# Patient Record
Sex: Male | Born: 1960 | Race: Asian | Hispanic: No | Marital: Married | Smoking: Former smoker
Health system: Southern US, Community
[De-identification: ages and names within clinical notes are randomized; demographics above are authoritative.]

---

## 2017-10-16 ENCOUNTER — Emergency Department
Admission: EM | Admit: 2017-10-16 | Discharge: 2017-10-16 | Disposition: A | Discharge status: Home / Self Care | Payer: Managed Care, Other (non HMO) | Attending: Emergency Medicine | Admitting: Emergency Medicine

## 2017-10-16 ENCOUNTER — Encounter: Payer: Self-pay | Admitting: Emergency Medicine

## 2017-10-16 ENCOUNTER — Emergency Department: Payer: Managed Care, Other (non HMO)

## 2017-10-16 DIAGNOSIS — W182XXA Fall in (into) shower or empty bathtub, initial encounter: Secondary | ICD-10-CM | POA: Diagnosis not present

## 2017-10-16 DIAGNOSIS — Y999 Unspecified external cause status: Secondary | ICD-10-CM | POA: Diagnosis not present

## 2017-10-16 DIAGNOSIS — S2232XA Fracture of one rib, left side, initial encounter for closed fracture: Secondary | ICD-10-CM | POA: Diagnosis not present

## 2017-10-16 DIAGNOSIS — Y939 Activity, unspecified: Secondary | ICD-10-CM | POA: Diagnosis not present

## 2017-10-16 DIAGNOSIS — Z87891 Personal history of nicotine dependence: Secondary | ICD-10-CM | POA: Insufficient documentation

## 2017-10-16 DIAGNOSIS — Y929 Unspecified place or not applicable: Secondary | ICD-10-CM | POA: Diagnosis not present

## 2017-10-16 DIAGNOSIS — S299XXA Unspecified injury of thorax, initial encounter: Secondary | ICD-10-CM | POA: Diagnosis present

## 2017-10-16 MED ORDER — TRAMADOL HCL 50 MG PO TABS: 50.0000 mg | ORAL_TABLET | Freq: Four times a day (QID) | ORAL | 0 refills | Status: AC | PRN

## 2017-10-16 NOTE — ED Triage Notes (Signed)
Patient presents to the ED with rib pain on the right side after falling getting out of the bathtub.  Patient denies any shortness of breath.  Reports some discomfort with deep breathing.  Patient states if he braces his ribs, breathing feels better.

## 2017-10-16 NOTE — ED Notes (Signed)
See triage note  States he fell hitting left rib area on bath tub  Ambulates well   Denies any SOB but does have pain on inspiration

## 2017-10-16 NOTE — Discharge Instructions (Addendum)
Follow discharge care instruction.  Advised repeat chest x-ray in 3 weeks.  Take medication as needed for pain.

## 2017-10-16 NOTE — ED Provider Notes (Signed)
[facility] Emergency Department Provider Note   ____________________________________________   First MD Initiated Contact with Patient 10/16/17 1024     (approximate)  I have reviewed the triage vital signs and the nursing notes.   HISTORY  Chief Complaint Fall    HPI Benjamin Fuentes is a 57 y.o. male complain of left rib pain secondary to slipping in bathtub.  States pain increases with deep inspirations.  Pain decreases with splinting of the left upper ribs.  Patient denies dyspnea.  Patient states the pain is a constant 4/10 and increases to a 6/10 with deep inspirations.  Patient described the pain is "achy".  No palliative measures for complaint.  History reviewed. No pertinent past medical history.  There are no active problems to display for this patient.   History reviewed. No pertinent surgical history.  Prior to Admission medications   Medication Sig Start Date End Date Taking? Authorizing Provider  traMADol (ULTRAM) 50 MG tablet Take 1 tablet (50 mg total) by mouth every 6 (six) hours as needed. 10/16/17 10/16/18  Joni Reining, PA-C    Allergies Patient has no known allergies.  No family history on file.  Social History Social History   Tobacco Use  . Smoking status: Former Games developer  . Smokeless tobacco: Former Neurosurgeon    Quit date: 10/16/1997  Substance Use Topics  . Alcohol use: Not on file  . Drug use: Not on file    Review of Systems Constitutional: No fever/chills Eyes: No visual changes. ENT: No sore throat. Cardiovascular: Denies chest pain. Respiratory: Denies shortness of breath. Gastrointestinal: No abdominal pain.  No nausea, no vomiting.  No diarrhea.  No constipation. Genitourinary: Negative for dysuria. Musculoskeletal: Left lateral rib pain. Skin: Negative for rash. Neurological: Negative for headaches, focal weakness or numbness.   ____________________________________________   PHYSICAL EXAM:  VITAL  SIGNS: ED Triage Vitals  Enc Vitals Group     BP 10/16/17 1007 120/82     Pulse Rate 10/16/17 1007 66     Resp --      Temp 10/16/17 1007 98.2 F (36.8 C)     Temp Source 10/16/17 1007 Oral     SpO2 10/16/17 1007 99 %     Weight 10/16/17 1007 147 lb (66.7 kg)     Height 10/16/17 1007 5\' 8"  (1.727 m)     Head Circumference --      Peak Flow --      Pain Score 10/16/17 1021 4     Pain Loc --      Pain Edu? --      Excl. in GC? --    Constitutional: Alert and oriented. Well appearing and in no acute distress. Cardiovascular: Normal rate, regular rhythm. Grossly normal heart sounds.  Good peripheral circulation. Respiratory: Normal respiratory effort.  No retractions. Lungs CTAB. Gastrointestinal: Soft and nontender. No distention. No abdominal bruits. No CVA tenderness. Musculoskeletal: No lower extremity tenderness nor edema.  No joint effusions. Neurologic:  Normal speech and language. No gross focal neurologic deficits are appreciated. No gait instability. Skin:  Skin is warm, dry and intact. No rash noted. Psychiatric: Mood and affect are normal. Speech and behavior are normal.  ____________________________________________   LABS (all labs ordered are listed, but only abnormal results are displayed)  Labs Reviewed - No data to display ____________________________________________  EKG   ____________________________________________  RADIOLOGY  ED MD interpretation:    Official radiology report(s): Dg Ribs Unilateral W/chest Left  Result Date: 10/16/2017  CLINICAL DATA:  Slipped in bathtub today, contusion, LEFT rib pain increased with deep inspiration EXAM: LEFT RIBS AND CHEST - 3+ VIEW COMPARISON:  None FINDINGS: Normal heart size, mediastinal contours, and pulmonary vascularity. Probable BILATERAL nipple shadows. Lungs clear. No infiltrate, pleural effusion or pneumothorax. Osseous demineralization. BB placed at site of symptoms LEFT chest. Nondisplaced fracture of the  lateral LEFT seventh rib. No additional rib fracture or bone destruction. IMPRESSION: Nondisplaced fracture lateral LEFT seventh rib. Electronically Signed   By: Ulyses Southward M.D.   On: 10/16/2017 11:27    ____________________________________________   PROCEDURES  Procedure(s) performed: None  Procedures  Critical Care performed: No  ____________________________________________   INITIAL IMPRESSION / ASSESSMENT AND PLAN / ED COURSE  As part of my medical decision making, I reviewed the following data within the electronic MEDICAL RECORD NUMBER    Left lateral chest wall pain secondary to mildly displaced seventh rib fracture.  Discussed x-ray findings with patient.  Patient given discharge care instruction advised take medication directed.  Patient will follow-up home station in 3 weeks for follow-up x-rays.      ____________________________________________   FINAL CLINICAL IMPRESSION(S) / ED DIAGNOSES  Final diagnoses:  Closed fracture of one rib of left side, initial encounter     ED Discharge Orders         Ordered    traMADol (ULTRAM) 50 MG tablet  Every 6 hours PRN     10/16/17 1140           Note:  This document was prepared using Dragon voice recognition software and may include unintentional dictation errors.    Joni Reining, PA-C 10/16/17 1146    Charlynne Pander, MD 10/16/17 [phone]

## 2020-04-02 IMAGING — CR DG RIBS W/ CHEST 3+V*L*
5 series · 5 of 5 positions shown · non-contrast
Comparison: None

CLINICAL DATA: Slipped in bathtub today, contusion, LEFT rib pain
increased with deep inspiration

EXAM:
LEFT RIBS AND CHEST - 3+ VIEW

[chest pa]
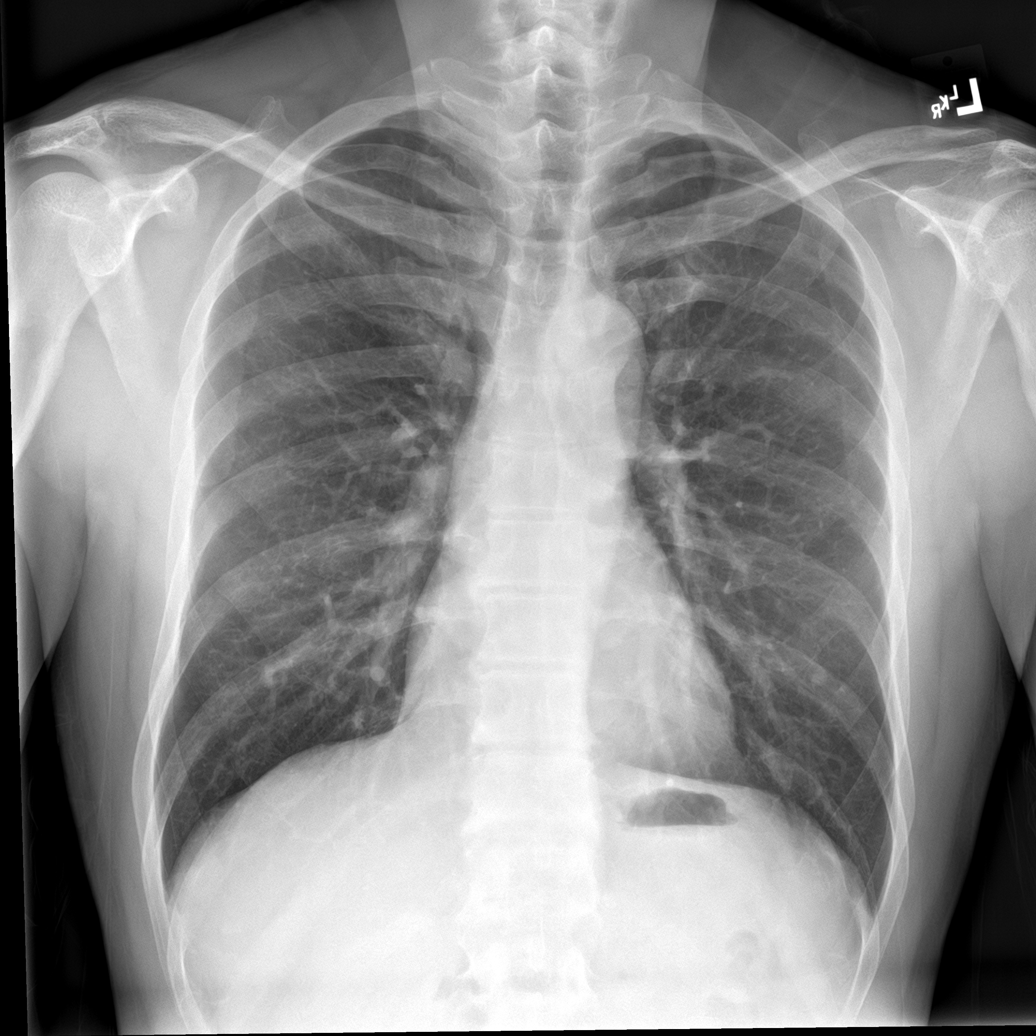

[rib pa (1 of 2)]
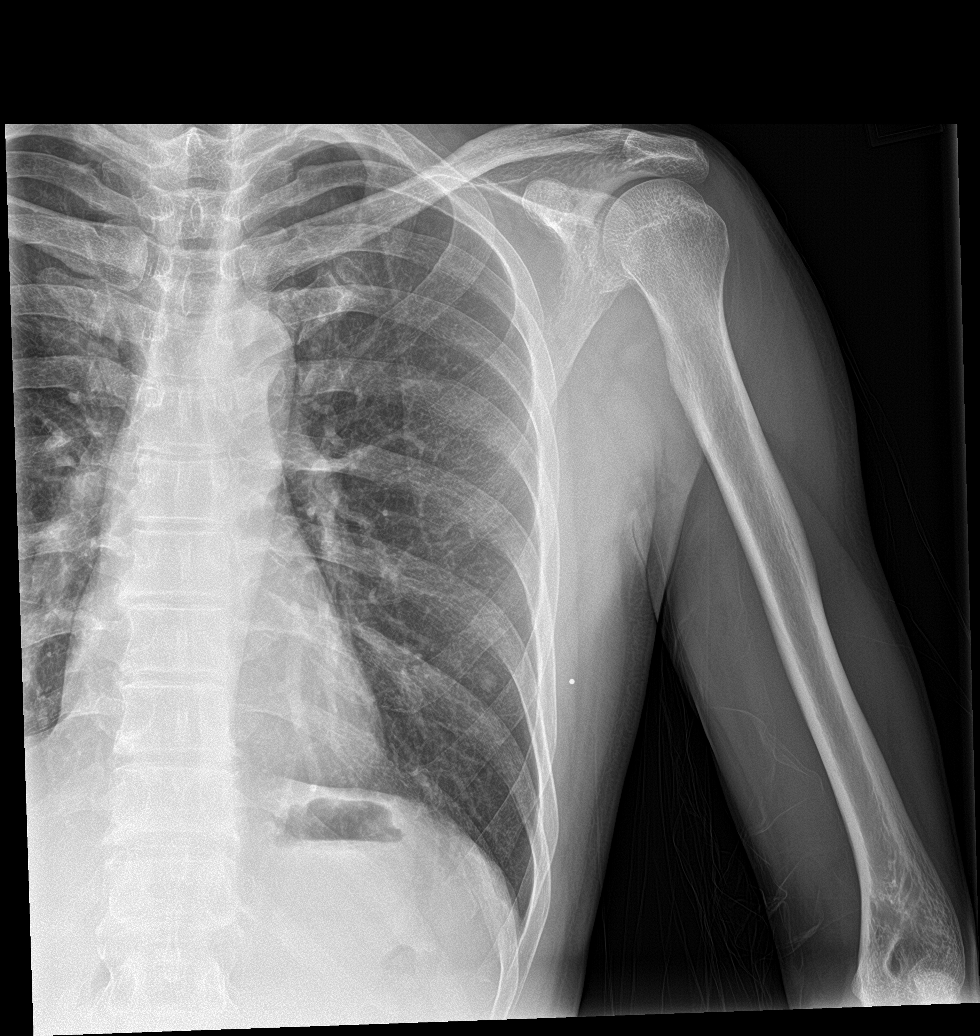

[rib pa obl (1 of 2)]
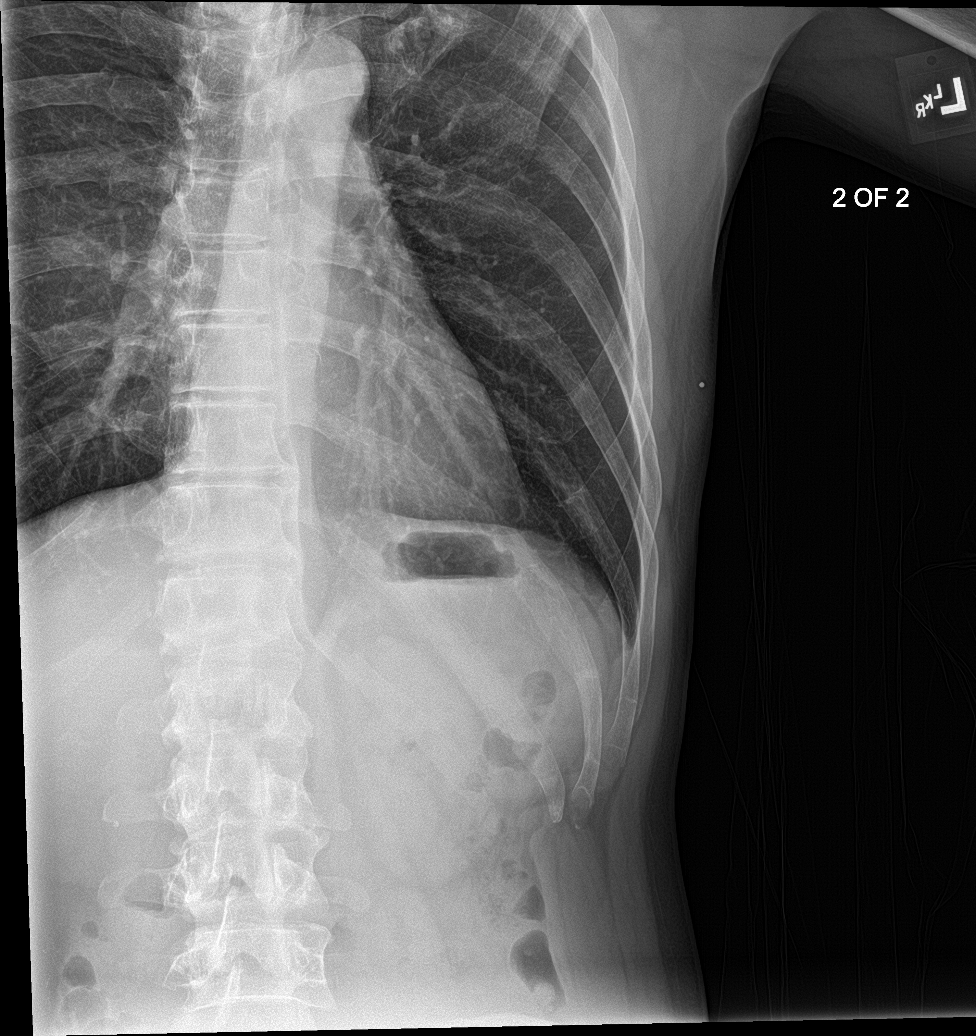

[rib pa (2 of 2)]
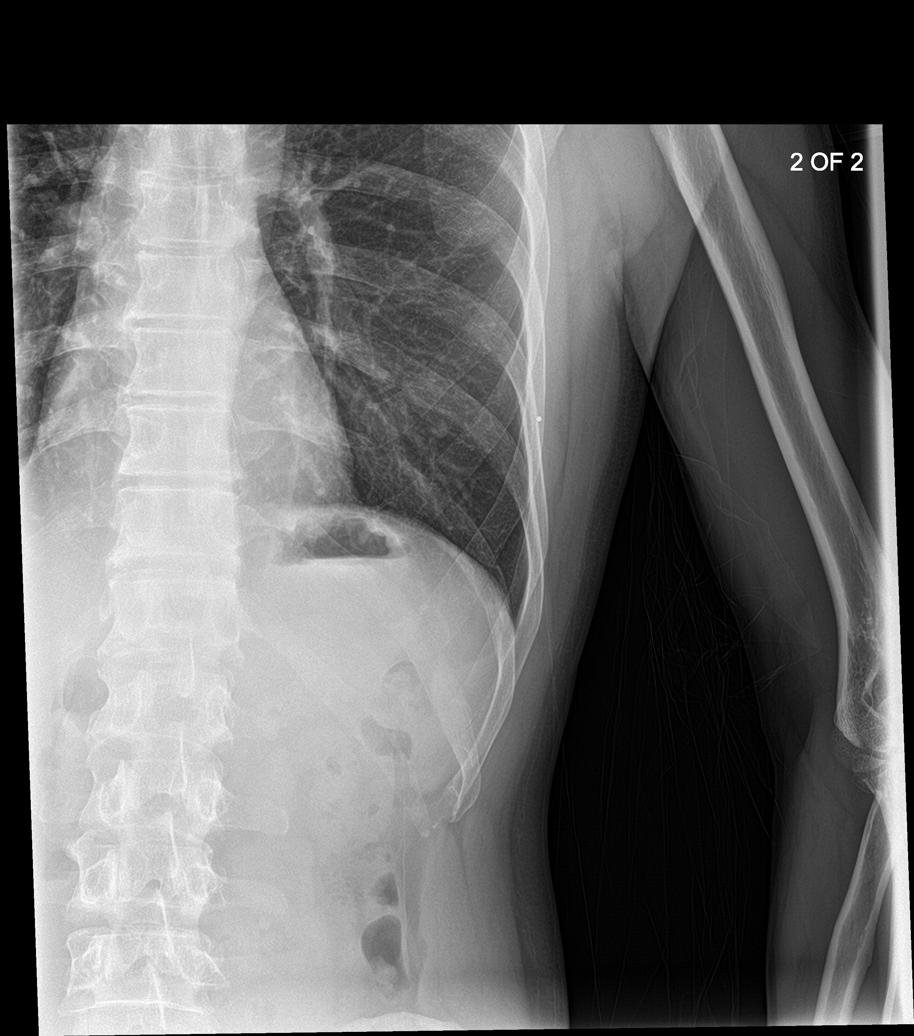

[rib pa obl (2 of 2)]
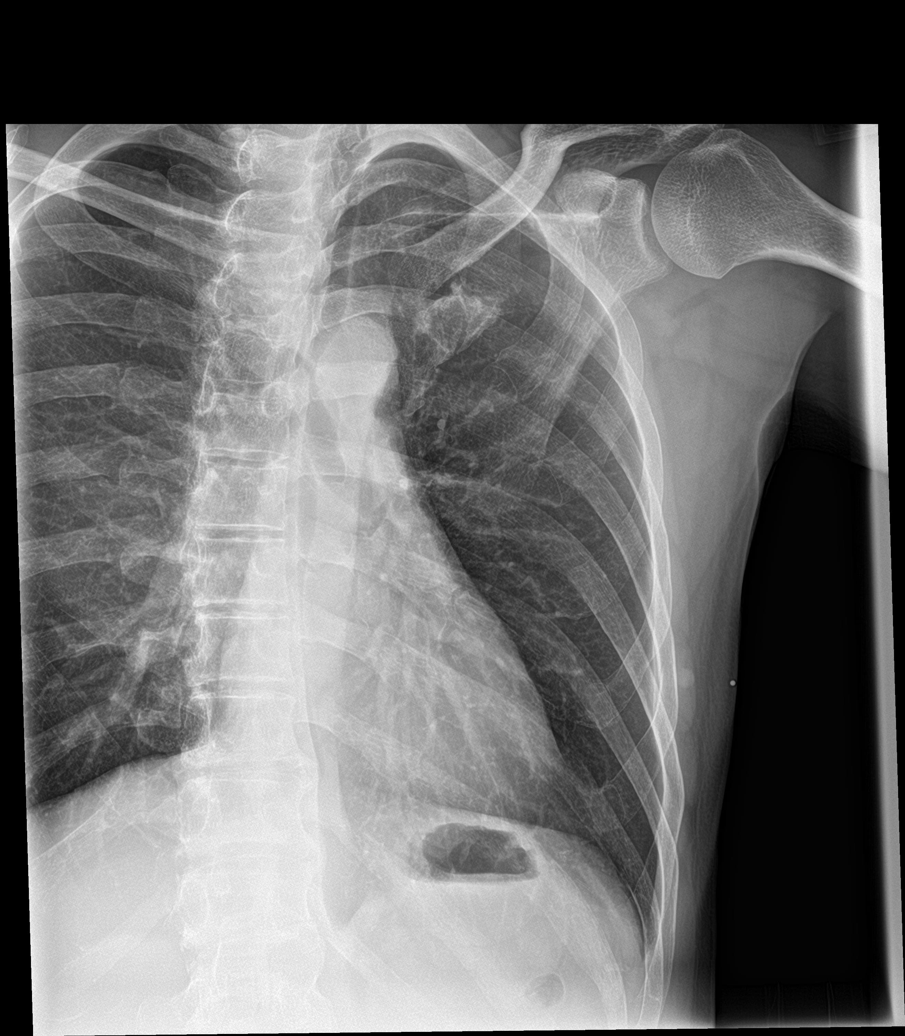

[5 of 5 positions shown; findings below may reference images not displayed]

FINDINGS: Normal heart size, mediastinal contours, and pulmonary vascularity.

Probable BILATERAL nipple shadows.

Lungs clear.

No infiltrate, pleural effusion or pneumothorax.

Osseous demineralization.

BB placed at site of symptoms LEFT chest.

Nondisplaced fracture of the lateral LEFT seventh rib.

No additional rib fracture or bone destruction.
IMPRESSION: Nondisplaced fracture lateral LEFT seventh rib.
